# Patient Record
Sex: Female | Born: 1950 | Race: Black or African American | Hispanic: No | Marital: Married | Smoking: Former smoker
Health system: Southern US, Community
[De-identification: ages and names within clinical notes are randomized; demographics above are authoritative.]

## PROBLEM LIST (undated history)

## (undated) DIAGNOSIS — N6019 Diffuse cystic mastopathy of unspecified breast: Secondary | ICD-10-CM

## (undated) DIAGNOSIS — H5462 Unqualified visual loss, left eye, normal vision right eye: Secondary | ICD-10-CM

## (undated) DIAGNOSIS — Z86010 Personal history of colonic polyps: Secondary | ICD-10-CM

## (undated) DIAGNOSIS — D320 Benign neoplasm of cerebral meninges: Secondary | ICD-10-CM

## (undated) DIAGNOSIS — Z9889 Other specified postprocedural states: Secondary | ICD-10-CM

## (undated) DIAGNOSIS — K219 Gastro-esophageal reflux disease without esophagitis: Secondary | ICD-10-CM

## (undated) HISTORY — DX: Personal history of colonic polyps: Z86.010

## (undated) HISTORY — PX: ABDOMINAL HYSTERECTOMY: SHX81

## (undated) HISTORY — DX: Unqualified visual loss, left eye, normal vision right eye: H54.62

## (undated) HISTORY — PX: TONSILLECTOMY: SUR1361

## (undated) HISTORY — DX: Other specified postprocedural states: Z98.890

## (undated) HISTORY — DX: Gastro-esophageal reflux disease without esophagitis: K21.9

## (undated) HISTORY — PX: BREAST BIOPSY: SHX20

## (undated) HISTORY — PX: BRAIN SURGERY: SHX531

## (undated) HISTORY — DX: Benign neoplasm of cerebral meninges: D32.0

## (undated) HISTORY — DX: Diffuse cystic mastopathy of unspecified breast: N60.19

---

## 1998-09-16 ENCOUNTER — Other Ambulatory Visit: Admission: RE | Admit: 1998-09-16 | Discharge: 1998-09-16 | Payer: Self-pay | Admitting: *Deleted

## 1998-11-17 ENCOUNTER — Emergency Department (HOSPITAL_COMMUNITY): Admission: EM | Admit: 1998-11-17 | Discharge: 1998-11-17 | Payer: Self-pay | Admitting: Emergency Medicine

## 1999-08-15 ENCOUNTER — Emergency Department (HOSPITAL_COMMUNITY): Admission: EM | Admit: 1999-08-15 | Discharge: 1999-08-15 | Payer: Self-pay

## 2000-01-06 ENCOUNTER — Encounter: Payer: Self-pay | Admitting: *Deleted

## 2000-01-06 ENCOUNTER — Ambulatory Visit (HOSPITAL_COMMUNITY): Admission: RE | Admit: 2000-01-06 | Discharge: 2000-01-06 | Payer: Self-pay | Admitting: *Deleted

## 2000-01-20 ENCOUNTER — Ambulatory Visit (HOSPITAL_COMMUNITY): Admission: RE | Admit: 2000-01-20 | Discharge: 2000-01-20 | Payer: Self-pay | Admitting: Neurosurgery

## 2000-01-20 ENCOUNTER — Encounter: Payer: Self-pay | Admitting: Neurosurgery

## 2000-04-16 ENCOUNTER — Emergency Department (HOSPITAL_COMMUNITY): Admission: EM | Admit: 2000-04-16 | Discharge: 2000-04-17 | Payer: Self-pay | Admitting: Internal Medicine

## 2000-08-08 ENCOUNTER — Ambulatory Visit (HOSPITAL_COMMUNITY): Admission: RE | Admit: 2000-08-08 | Discharge: 2000-08-08 | Payer: Self-pay | Admitting: Gastroenterology

## 2000-09-17 ENCOUNTER — Other Ambulatory Visit: Admission: RE | Admit: 2000-09-17 | Discharge: 2000-09-17 | Payer: Self-pay | Admitting: Family Medicine

## 2000-10-04 ENCOUNTER — Encounter: Payer: Self-pay | Admitting: *Deleted

## 2000-10-04 ENCOUNTER — Ambulatory Visit (HOSPITAL_COMMUNITY): Admission: RE | Admit: 2000-10-04 | Discharge: 2000-10-04 | Payer: Self-pay | Admitting: *Deleted

## 2002-01-11 ENCOUNTER — Ambulatory Visit (HOSPITAL_COMMUNITY): Admission: RE | Admit: 2002-01-11 | Discharge: 2002-01-11 | Payer: Self-pay | Admitting: *Deleted

## 2002-01-11 ENCOUNTER — Encounter: Payer: Self-pay | Admitting: *Deleted

## 2002-05-14 ENCOUNTER — Other Ambulatory Visit: Admission: RE | Admit: 2002-05-14 | Discharge: 2002-05-14 | Payer: Self-pay | Admitting: Family Medicine

## 2003-01-29 ENCOUNTER — Encounter: Payer: Self-pay | Admitting: Family Medicine

## 2003-01-29 ENCOUNTER — Encounter: Admission: RE | Admit: 2003-01-29 | Discharge: 2003-01-29 | Payer: Self-pay | Admitting: Family Medicine

## 2004-01-14 ENCOUNTER — Other Ambulatory Visit: Admission: RE | Admit: 2004-01-14 | Discharge: 2004-01-14 | Payer: Self-pay | Admitting: Family Medicine

## 2004-07-18 ENCOUNTER — Ambulatory Visit (HOSPITAL_COMMUNITY): Admission: RE | Admit: 2004-07-18 | Discharge: 2004-07-18 | Payer: Self-pay | Admitting: Family Medicine

## 2004-10-03 ENCOUNTER — Ambulatory Visit (HOSPITAL_COMMUNITY): Admission: RE | Admit: 2004-10-03 | Discharge: 2004-10-03 | Payer: Self-pay | Admitting: *Deleted

## 2005-09-18 ENCOUNTER — Encounter (INDEPENDENT_AMBULATORY_CARE_PROVIDER_SITE_OTHER): Payer: Self-pay | Admitting: Specialist

## 2005-09-18 ENCOUNTER — Ambulatory Visit (HOSPITAL_COMMUNITY): Admission: RE | Admit: 2005-09-18 | Discharge: 2005-09-18 | Payer: Self-pay | Admitting: Gastroenterology

## 2005-12-13 ENCOUNTER — Other Ambulatory Visit: Admission: RE | Admit: 2005-12-13 | Discharge: 2005-12-13 | Payer: Self-pay | Admitting: Family Medicine

## 2006-05-01 ENCOUNTER — Ambulatory Visit (HOSPITAL_COMMUNITY): Admission: RE | Admit: 2006-05-01 | Discharge: 2006-05-01 | Payer: Self-pay | Admitting: Family Medicine

## 2007-07-10 ENCOUNTER — Other Ambulatory Visit: Admission: RE | Admit: 2007-07-10 | Discharge: 2007-07-10 | Payer: Self-pay | Admitting: Family Medicine

## 2008-07-29 ENCOUNTER — Ambulatory Visit (HOSPITAL_COMMUNITY): Admission: RE | Admit: 2008-07-29 | Discharge: 2008-07-29 | Payer: Self-pay | Admitting: Family Medicine

## 2009-01-25 ENCOUNTER — Encounter: Admission: RE | Admit: 2009-01-25 | Discharge: 2009-01-25 | Payer: Self-pay | Admitting: Family Medicine

## 2009-01-29 ENCOUNTER — Encounter: Admission: RE | Admit: 2009-01-29 | Discharge: 2009-01-29 | Payer: Self-pay | Admitting: Family Medicine

## 2009-02-12 ENCOUNTER — Encounter: Admission: RE | Admit: 2009-02-12 | Discharge: 2009-02-12 | Payer: Self-pay | Admitting: Neurological Surgery

## 2010-07-30 ENCOUNTER — Emergency Department (HOSPITAL_COMMUNITY)
Admission: EM | Admit: 2010-07-30 | Discharge: 2010-07-31 | Disposition: A | Discharge status: Home / Self Care | Payer: 59 | Attending: Emergency Medicine | Admitting: Emergency Medicine

## 2010-07-30 DIAGNOSIS — R112 Nausea with vomiting, unspecified: Secondary | ICD-10-CM | POA: Insufficient documentation

## 2010-07-30 DIAGNOSIS — R109 Unspecified abdominal pain: Secondary | ICD-10-CM | POA: Insufficient documentation

## 2010-07-30 DIAGNOSIS — R6883 Chills (without fever): Secondary | ICD-10-CM | POA: Insufficient documentation

## 2010-07-30 DIAGNOSIS — R197 Diarrhea, unspecified: Secondary | ICD-10-CM | POA: Insufficient documentation

## 2010-11-11 NOTE — Procedures (Signed)
[place]. [facility]  Patient:    Leslie Travis, Leslie Travis                      MRN: [id number] Proc. Date: 08/08/00 Adm. Date:  [id number] Attending:  Charna Elizabeth                           Procedure Report  DATE OF BIRTH:  03/23/1951  REFERRING PHYSICIAN:  ____________  PROCEDURE PERFORMED:  Colonoscopy.  ENDOSCOPIST:  Anselmo Rod, M.D.  INSTRUMENT USED:  Olympus video colonoscope.  INDICATIONS FOR PROCEDURE:  A 60 year old African-American female with a history of colon cancer in a maternal aunt.  Screening colonoscopy being done to rule out colonic polyps, masses, hemorrhoids, etc.  PREPROCEDURE PREPARATION:  Informed consent was procured from the patient. The patient was fasted for eight hours prior to the procedure and prepped with a bottle of magnesium citrate and a gallon of NuLytely the night prior to the procedure.  PREPROCEDURE PHYSICAL:  The patient had stable vital signs.  Neck supple. Chest clear to auscultation.  S1, S2 regular.  Abdomen soft with normal abdominal bowel sounds.  DESCRIPTION OF PROCEDURE:  The patient was placed in the left lateral decubitus position and sedated with 40 mg of Demerol and 4 mg of Versed intravenously.  Once the patient was adequately sedated and maintained on low-flow oxygen and continuous cardiac monitoring, the Olympus video colonoscope was advanced from the rectum to the cecum without difficulty.  The entire colonic mucosa appeared healthy with a normal vascular pattern.  No masses, polyps, erosions, ulcers, or diverticula were seen.  IMPRESSION:  Normal colonoscopy.  RECOMMENDATIONS: 1. Considering her family history of colon cancer, repeat colorectal cancer    screening is recommended in the next five years unless the patient were to    develop any abnormal symptoms in the interim. 2. A high fiber diet and liberal fluid intake has been advocated.  Outpatient    follow-up is advised on a p.r.n.  basis.  DD:  08/09/00 TD:  08/09/00 Job: [id number]

## 2010-11-11 NOTE — Op Note (Signed)
NAMETARALEE, Leslie Travis               ACCOUNT NO.:  [id number]   MEDICAL RECORD NO.:  [id number]          PATIENT TYPE:  AMB   LOCATION:  ENDO                         FACILITY:  [facility]   PHYSICIAN:  Anselmo Rod, M.D.  DATE OF BIRTH:  June 20, 1951   DATE OF PROCEDURE:  09/18/2005  DATE OF DISCHARGE:                                 OPERATIVE REPORT   PROCEDURE:  Colonoscopy with snare polypectomy x 1.   ENDOSCOPIST:  Jyothi. Elsie Amis, M.D.   INSTRUMENT USED:  Olympus video colonoscope.   INDICATIONS FOR PROCEDURE:  60 year old female undergoing colonoscopy.  The  patient has a family history of colon cancer, rule out colonic polyps,  masses, etc.   PREPROCEDURE PREPARATION:  Informed consent was obtained from the patient.  The patient was fasted for four hours prior to the procedure and prepped  with Osmo prep pills the night of and the morning of the procedure.  The  risks and benefits of the procedure including a 10% miss rate of cancer and  polyps were discussed with her, as well.   PREPROCEDURE PHYSICAL:  Patient with stable vital signs.  Neck supple.  Chest clear to auscultation.  S1 and S2 regular.  Abdomen soft with normal  bowel sounds.   DESCRIPTION OF PROCEDURE:  The patient was placed in the left lateral  decubitus position, sedated with 100 mcg Fentanyl and 10 mg Versed in slow  incremental doses.  Once the patient was adequately sedated, maintained on  low flow oxygen and continuous cardiac monitoring, the Olympus video  colonoscope was advanced from the rectum to the cecum.  A small sessile  polyp was snared from the IC valve and this was removed via hot snare.  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  The rest of the  exam was unremarkable.  No other masses or polyps were seen.  The  appendiceal orifice and ileocecal valve were clearly visualized and  photographed.  The terminal ileum appeared normal.   IMPRESSION:  1.  Small sessile polyp  removed by hot snare from over the IC valve.  2.  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  3.  Normal terminal ileum.   RECOMMENDATIONS:  1.  Await pathology results.  2.  Repeat colonoscopy depending on pathology results.  3.  Avoid all nonsteroidals for now.  4.  Outpatient follow up as need arises in the future.      Anselmo Rod, M.D.  Electronically Signed     JNM/MEDQ  D:  09/19/2005  T:  09/20/2005  Job:  [id number]   cc:   Dario Guardian, M.D.  Fax: [phone]

## 2011-01-16 IMAGING — CR DG CERVICAL SPINE COMPLETE 4+V
6 series · 6 of 6 positions shown · non-contrast
Comparison: None

CLINICAL DATA: Left arm pain and numbness.  No acute injury.

CERVICAL SPINE - COMPLETE 4+ VIEW

[view not recorded (1 of 6)]
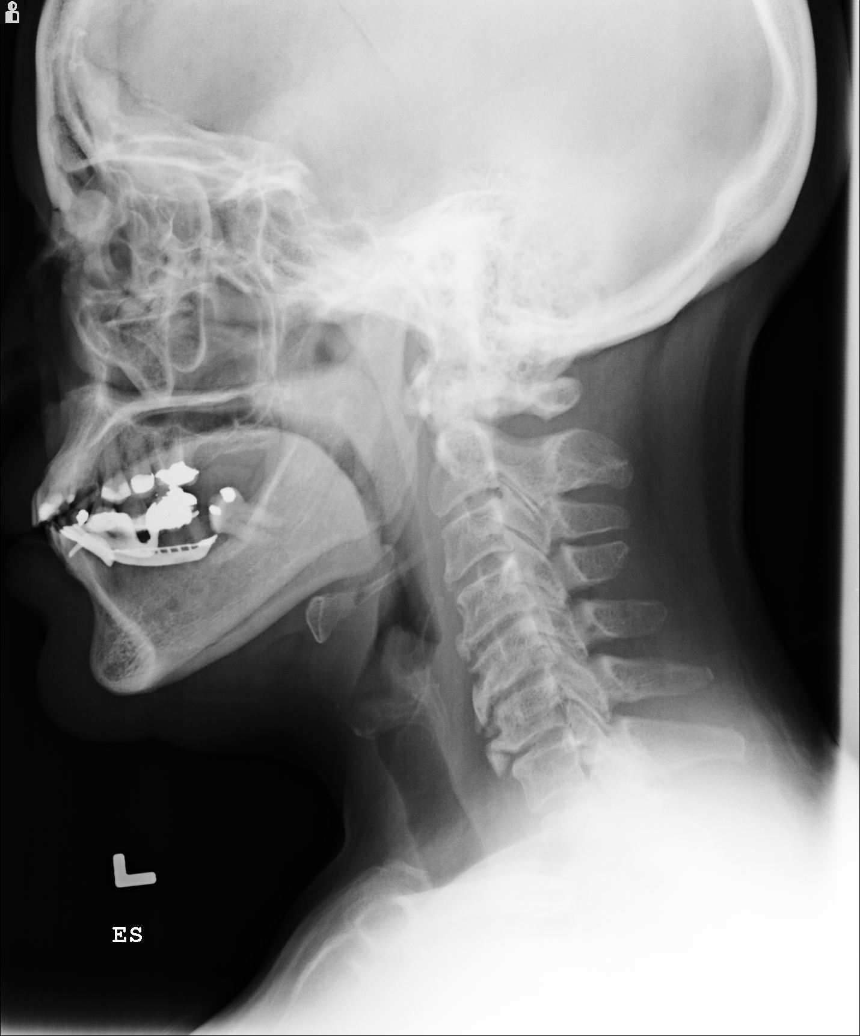

[view not recorded (2 of 6)]
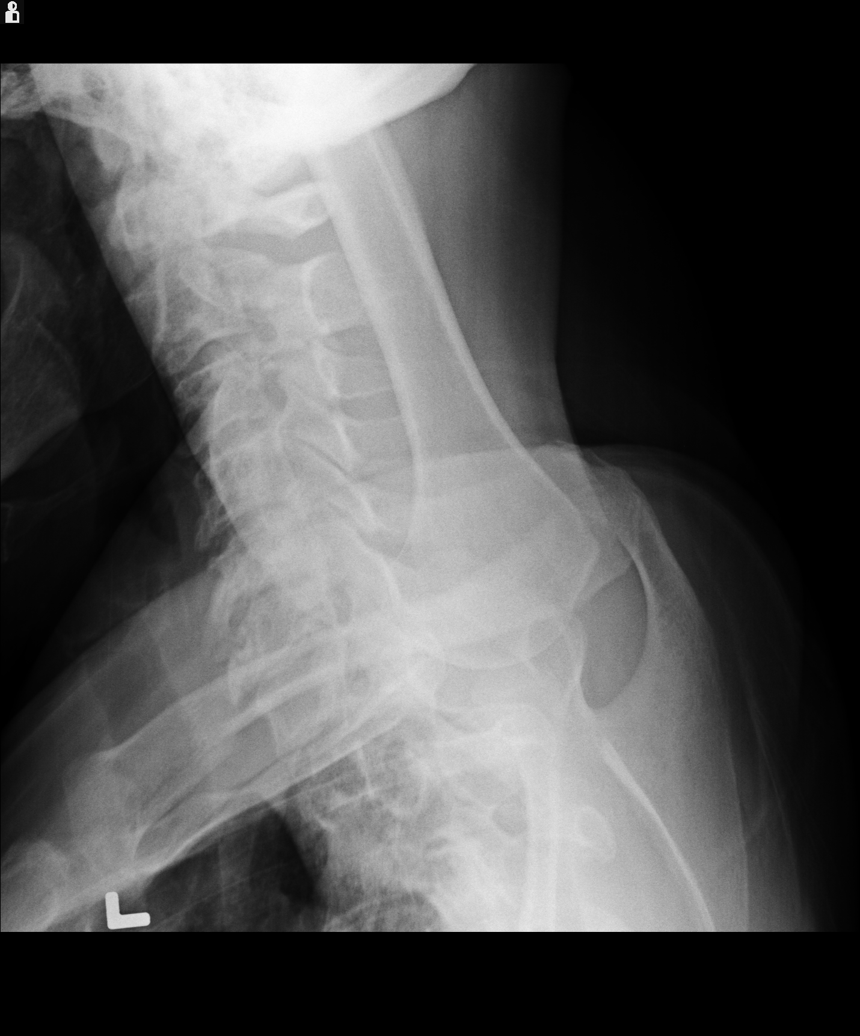

[view not recorded (3 of 6)]
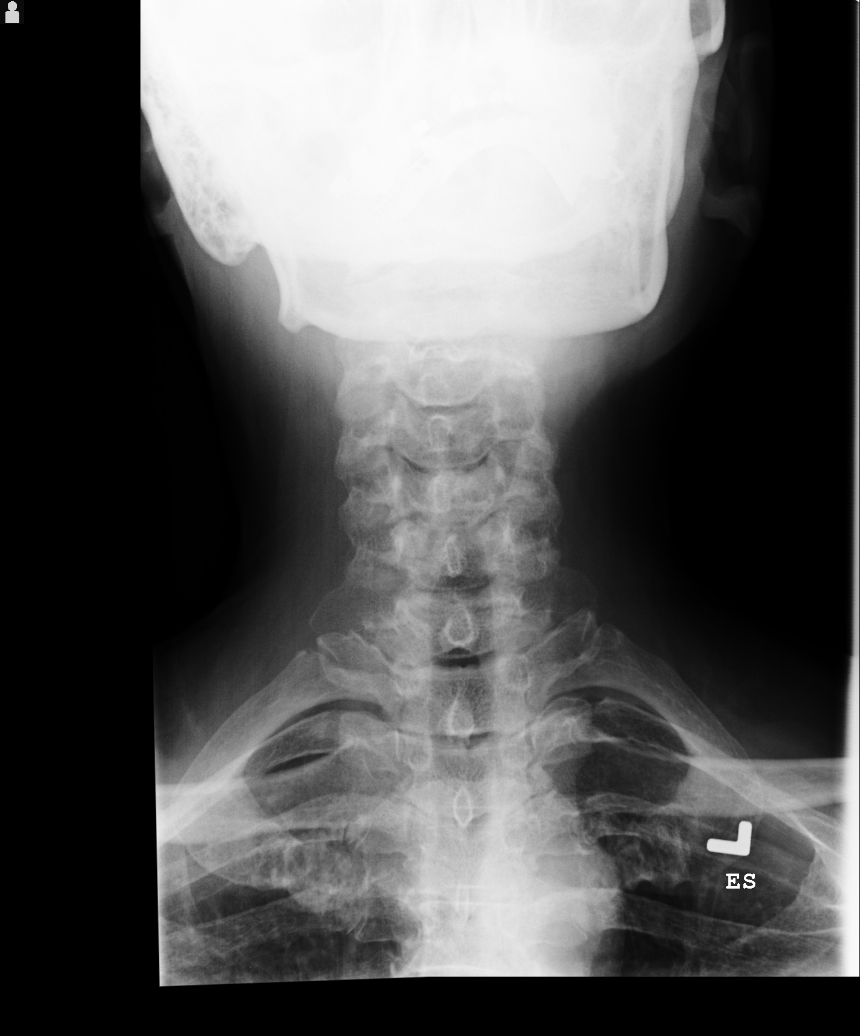

[view not recorded (4 of 6)]
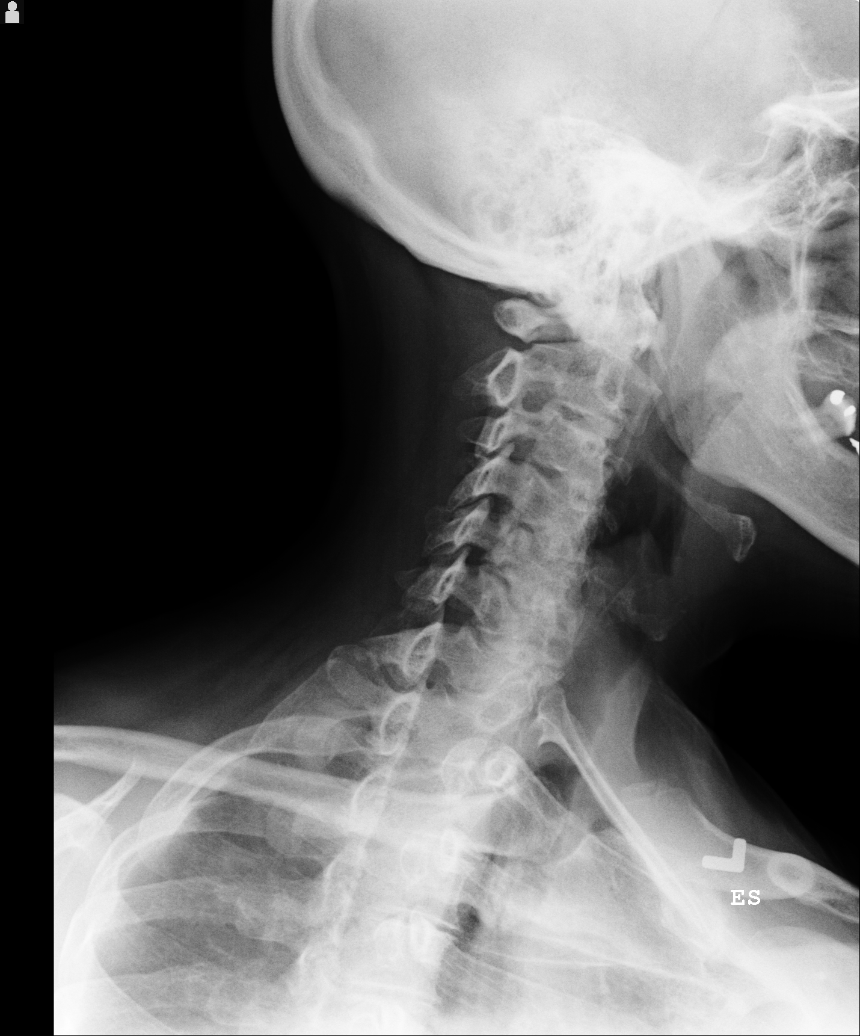

[view not recorded (5 of 6)]
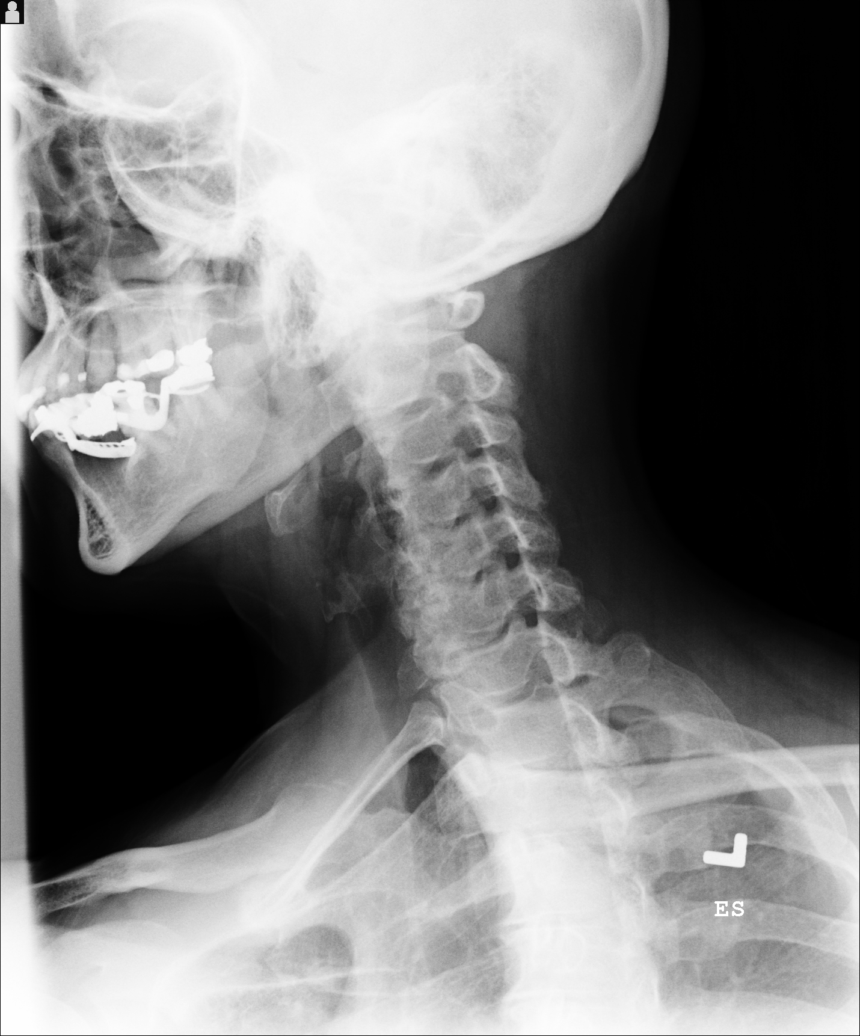

[view not recorded (6 of 6)]
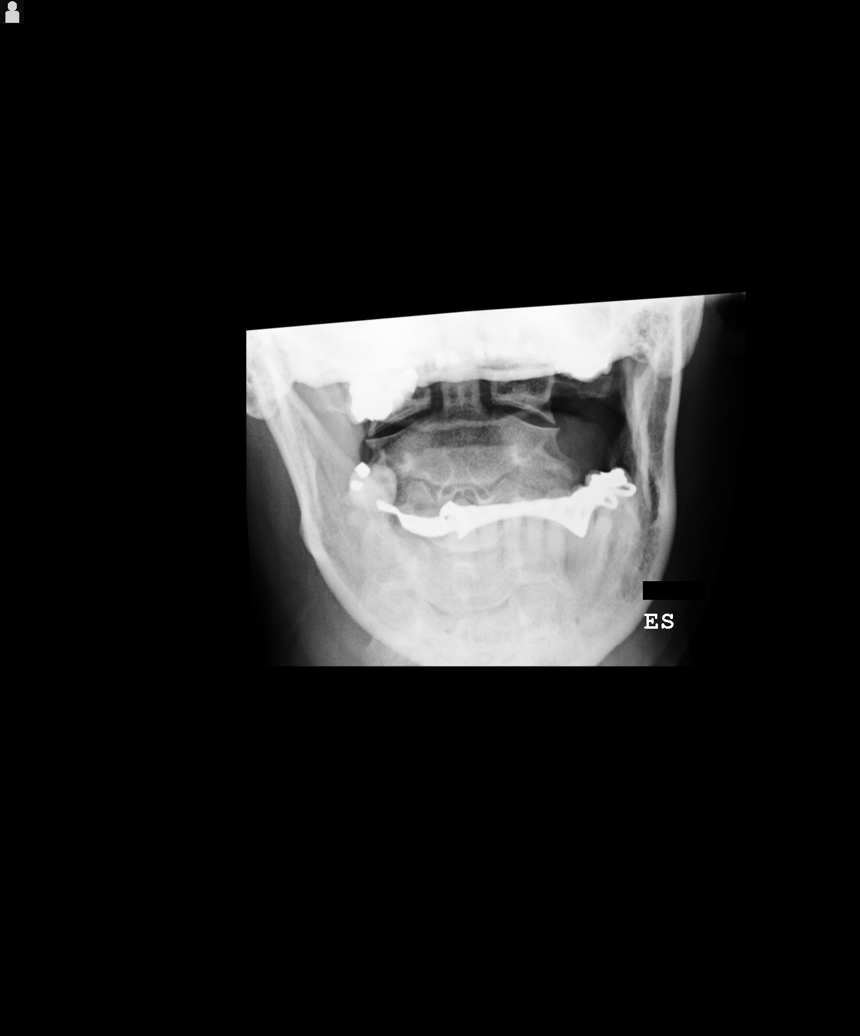

[6 of 6 positions shown; findings below may reference images not displayed]

FINDINGS: The prevertebral soft tissues are normal.  The alignment
is anatomic through T1.  There is mild disc space loss with
prominent osteophytes and facet hypertrophy at C4-C5, C5-C6 and C6-
C7.  The oblique views demonstrate mild biforaminal stenosis at C4-
C5 and C5-C6.  There is no evidence of acute fracture.  The C1-C2
articulation appears normal in the AP projection.
IMPRESSION: Spondylosis with mild biforaminal stenosis at C4-C5 and C5-C6.  No
acute osseous findings.

## 2011-10-23 DIAGNOSIS — Z86010 Personal history of colonic polyps: Secondary | ICD-10-CM

## 2011-10-23 DIAGNOSIS — Z9889 Other specified postprocedural states: Secondary | ICD-10-CM

## 2011-10-23 HISTORY — DX: Other specified postprocedural states: Z98.890

## 2011-10-23 HISTORY — DX: Personal history of colonic polyps: Z86.010

## 2011-11-24 ENCOUNTER — Emergency Department (HOSPITAL_COMMUNITY)
Admission: EM | Admit: 2011-11-24 | Discharge: 2011-11-25 | Disposition: A | Discharge status: Home / Self Care | Payer: 59 | Attending: Emergency Medicine | Admitting: Emergency Medicine

## 2011-11-24 ENCOUNTER — Encounter (HOSPITAL_COMMUNITY): Payer: Self-pay | Admitting: Emergency Medicine

## 2011-11-24 DIAGNOSIS — R197 Diarrhea, unspecified: Secondary | ICD-10-CM | POA: Insufficient documentation

## 2011-11-24 DIAGNOSIS — R10819 Abdominal tenderness, unspecified site: Secondary | ICD-10-CM | POA: Insufficient documentation

## 2011-11-24 DIAGNOSIS — R109 Unspecified abdominal pain: Secondary | ICD-10-CM | POA: Insufficient documentation

## 2011-11-24 DIAGNOSIS — R1110 Vomiting, unspecified: Secondary | ICD-10-CM

## 2011-11-24 DIAGNOSIS — N200 Calculus of kidney: Secondary | ICD-10-CM | POA: Insufficient documentation

## 2011-11-24 LAB — CBC
HCT: 43.6 % (ref 36.0–46.0)
Platelets: 291 10*3/uL (ref 150–400)
RDW: 13.6 % (ref 11.5–15.5)
WBC: 10.2 10*3/uL (ref 4.0–10.5)

## 2011-11-24 LAB — DIFFERENTIAL
Basophils Absolute: 0 10*3/uL (ref 0.0–0.1)
Basophils Relative: 0 % (ref 0–1)
Eosinophils Absolute: 0 10*3/uL (ref 0.0–0.7)
Monocytes Absolute: 1 10*3/uL (ref 0.1–1.0)
Monocytes Relative: 10 % (ref 3–12)
Neutro Abs: 8 10*3/uL — ABNORMAL HIGH (ref 1.7–7.7)
Neutrophils Relative %: 79 % — ABNORMAL HIGH (ref 43–77)

## 2011-11-24 LAB — BASIC METABOLIC PANEL
Calcium: 9.1 mg/dL (ref 8.4–10.5)
Chloride: 105 mEq/L (ref 96–112)
Creatinine, Ser: 0.77 mg/dL (ref 0.50–1.10)
GFR calc Af Amer: >90 mL/min (ref >90)
Glucose, Bld: 117 mg/dL — ABNORMAL HIGH (ref 70–99)
Potassium: 4.5 mEq/L (ref 3.5–5.1)
Sodium: 141 mEq/L (ref 135–145)

## 2011-11-24 MED ORDER — ONDANSETRON 8 MG PO TBDP
8.0000 mg | ORAL_TABLET | Freq: Once | ORAL | Status: AC
  Administered 2011-11-24: 8 mg via ORAL

## 2011-11-24 MED ORDER — DIPHENHYDRAMINE HCL 50 MG/ML IJ SOLN
25.0000 mg | Freq: Once | INTRAMUSCULAR | Status: AC
  Administered 2011-11-24: 25 mg via INTRAVENOUS
  Filled 2011-11-24: qty 1

## 2011-11-24 MED ORDER — SODIUM CHLORIDE 0.9 % IV BOLUS (SEPSIS): 1000.0000 mL | Freq: Once | INTRAVENOUS | Status: DC

## 2011-11-24 MED ORDER — METOCLOPRAMIDE HCL 5 MG/ML IJ SOLN
10.0000 mg | Freq: Once | INTRAMUSCULAR | Status: AC
  Administered 2011-11-24: 10 mg via INTRAVENOUS
  Filled 2011-11-24: qty 2

## 2011-11-24 MED ORDER — ONDANSETRON 4 MG PO TBDP: 4.0000 mg | ORAL_TABLET | Freq: Once | ORAL | Status: DC

## 2011-11-24 MED ORDER — ONDANSETRON 8 MG PO TBDP
ORAL_TABLET | ORAL | Status: AC
  Administered 2011-11-24: 8 mg via ORAL
  Filled 2011-11-24: qty 1

## 2011-11-24 MED ORDER — SODIUM CHLORIDE 0.9 % IV SOLN
INTRAVENOUS | Status: DC
  Administered 2011-11-24: via INTRAVENOUS

## 2011-11-24 MED ORDER — SODIUM CHLORIDE 0.9 % IV BOLUS (SEPSIS)
500.0000 mL | INTRAVENOUS | Status: AC
  Administered 2011-11-24: 500 mL via INTRAVENOUS

## 2011-11-24 MED ORDER — PANTOPRAZOLE SODIUM 40 MG IV SOLR
40.0000 mg | Freq: Once | INTRAVENOUS | Status: AC
  Administered 2011-11-24: 40 mg via INTRAVENOUS
  Filled 2011-11-24: qty 40

## 2011-11-24 MED ORDER — PROMETHAZINE HCL 25 MG/ML IJ SOLN
12.5000 mg | Freq: Once | INTRAMUSCULAR | Status: AC
  Administered 2011-11-24: 12.5 mg via INTRAVENOUS
  Filled 2011-11-24: qty 1

## 2011-11-24 MED ORDER — LOPERAMIDE HCL 2 MG PO CAPS
4.0000 mg | ORAL_CAPSULE | Freq: Once | ORAL | Status: AC
  Administered 2011-11-24: 4 mg via ORAL
  Filled 2011-11-24: qty 2

## 2011-11-24 NOTE — ED Notes (Signed)
Pt alert, arrives from home, c/o gen abd pain, diarrhea, nausea, onset this evening, denies recent ill exposures, resp even unlabored, skin pwd

## 2011-11-24 NOTE — ED Notes (Signed)
[id number]<YQ> Expected date:<BR> Expected time:<BR> Means of arrival:<BR> Comments:<BR> Triage 2

## 2011-11-25 ENCOUNTER — Emergency Department (HOSPITAL_COMMUNITY): Payer: 59

## 2011-11-25 LAB — HEPATIC FUNCTION PANEL
Albumin: 4 g/dL (ref 3.5–5.2)
Total Protein: 7.7 g/dL (ref 6.0–8.3)

## 2011-11-25 MED ORDER — METRONIDAZOLE 500 MG PO TABS
500.0000 mg | ORAL_TABLET | Freq: Once | ORAL | Status: AC
  Administered 2011-11-25: 500 mg via ORAL
  Filled 2011-11-25: qty 1

## 2011-11-25 MED ORDER — ONDANSETRON HCL 4 MG/2ML IJ SOLN
INTRAMUSCULAR | Status: AC
  Administered 2011-11-25: 4 mg
  Filled 2011-11-25: qty 2

## 2011-11-25 MED ORDER — CIPROFLOXACIN HCL 500 MG PO TABS: 500.0000 mg | ORAL_TABLET | Freq: Two times a day (BID) | ORAL | Status: AC

## 2011-11-25 MED ORDER — PROMETHAZINE HCL 25 MG/ML IJ SOLN
12.5000 mg | Freq: Once | INTRAMUSCULAR | Status: AC
  Administered 2011-11-25: 12.5 mg via INTRAVENOUS
  Filled 2011-11-25: qty 1

## 2011-11-25 MED ORDER — IOHEXOL 300 MG/ML  SOLN
100.0000 mL | Freq: Once | INTRAMUSCULAR | Status: AC | PRN
  Administered 2011-11-25: 100 mL via INTRAVENOUS

## 2011-11-25 MED ORDER — METRONIDAZOLE 500 MG PO TABS: 500.0000 mg | ORAL_TABLET | Freq: Three times a day (TID) | ORAL | Status: AC

## 2011-11-25 MED ORDER — CIPROFLOXACIN HCL 500 MG PO TABS
500.0000 mg | ORAL_TABLET | Freq: Once | ORAL | Status: AC
  Administered 2011-11-25: 500 mg via ORAL
  Filled 2011-11-25: qty 1

## 2011-11-25 NOTE — ED Notes (Signed)
Pt woke up, began vomiting, c/o abd cramping, MD notified

## 2011-11-25 NOTE — Discharge Instructions (Signed)
Nausea and Vomiting   Nausea means you feel sick to your stomach. Throwing up (vomiting) is a reflex where stomach contents come out of your mouth.   HOME CARE   Take medicine as told by your doctor.   Do not force yourself to eat. However, you do need to drink fluids.   If you feel like eating, eat a normal diet as told by your doctor.   Eat rice, wheat, potatoes, bread, lean meats, yogurt, fruits, and vegetables.   Avoid high-fat foods.   Drink enough fluids to keep your pee (urine) clear or pale yellow.   Ask your doctor how to replace body fluid losses (rehydrate). Signs of body fluid loss (dehydration) include:   Feeling very thirsty.   Dry lips and mouth.   Feeling dizzy.   Dark pee.   Peeing less than normal.   Feeling confused.   Fast breathing or heart rate.   GET HELP RIGHT AWAY IF:   You have blood in your throw up.   You have black or bloody poop (stool).   You have a bad headache or stiff neck.   You feel confused.   You have bad belly (abdominal) pain.   You have chest pain or trouble breathing.   You do not pee at least once every 8 hours.   You have cold, clammy skin.   You keep throwing up after 24 to 48 hours.   You have a fever.   MAKE SURE YOU:   Understand these instructions.   Will watch your condition.   Will get help right away if you are not doing well or get worse.   Document Released: 11/29/2007 Document Revised: 06/01/2011 Document Reviewed: 11/11/2010   Exit[employer] Patient Information 2012 [employer].

## 2011-11-25 NOTE — Progress Notes (Signed)
Pt awaiting CT abd. Hold off on discharge.

## 2011-11-25 NOTE — ED Provider Notes (Signed)
History     CSN: [id number]  Arrival date & time 11/24/11  2123   First MD Initiated Contact with Patient 11/24/11 2257      Chief Complaint  Patient presents with  . Abdominal Pain  . Emesis  . Diarrhea    (Consider location/radiation/quality/duration/timing/severity/associated sxs/prior treatment) HPI  Patient relates she has had "heartburn" all morning and indicates her upper chest has burning. She also states sometimes she gets a burning in her throat. She states she's had that before. She states she takes protonic. She states about noon she states some hot dogs, 8 some oatmeal cookies and draped is moody after which she started getting diffuse abdominal pain that is sharp with nausea, vomiting, and diarrhea. She denies any fever. She denies being around anybody else is ill.  PCP Dr Erby Pian  History reviewed. No pertinent past medical history.  Past Surgical History  Procedure Date  . Abdominal hysterectomy   . Brain surgery     No family history on file.  History  Substance Use Topics  . Smoking status: Never Smoker   . Smokeless tobacco: Not on file  . Alcohol Use: No  retired  OB History    Grav Para Term Preterm Abortions TAB SAB Ect Mult Living                  Review of Systems  All other systems reviewed and are negative.    Allergies  Review of patient's allergies indicates no known allergies.  Home Medications   Current Outpatient Rx  Name Route Sig Dispense Refill  . PANTOPRAZOLE SODIUM 40 MG PO TBEC Oral Take 40 mg by mouth daily.      BP 120/56  Pulse 78  Temp(Src) 97.8 F (36.6 C) (Oral)  Resp 20  Wt 235 lb (106.595 kg)  SpO2 100%  Vital signs normal    Physical Exam  Nursing note and vitals reviewed. Constitutional: She is oriented to person, place, and time. She appears well-developed and well-nourished.  Non-toxic appearance. She does not appear ill. No distress.       Patient is actively vomiting  HENT:  Head:  Normocephalic and atraumatic.  Right Ear: External ear normal.  Left Ear: External ear normal.  Nose: Nose normal. No mucosal edema or rhinorrhea.  Mouth/Throat: Oropharynx is clear and moist and mucous membranes are normal. No dental abscesses or uvula swelling.  Eyes: Conjunctivae and EOM are normal. Pupils are equal, round, and reactive to light.  Neck: Normal range of motion and full passive range of motion without pain. Neck supple.  Cardiovascular: Normal rate, regular rhythm and normal heart sounds.  Exam reveals no gallop and no friction rub.   No murmur heard. Pulmonary/Chest: Effort normal and breath sounds normal. No respiratory distress. She has no wheezes. She has no rhonchi. She has no rales. She exhibits no tenderness and no crepitus.  Abdominal: Soft. Normal appearance and bowel sounds are normal. She exhibits no distension. There is tenderness. There is no rebound and no guarding.       Mild diffuse lower abdominal tenderness  Musculoskeletal: Normal range of motion. She exhibits no edema and no tenderness.       Moves all extremities well.   Neurological: She is alert and oriented to person, place, and time. She has normal strength. No cranial nerve deficit.  Skin: Skin is warm, dry and intact. No rash noted. No erythema. No pallor.  Psychiatric: She has a normal mood and affect.  Her speech is normal and behavior is normal. Her mood appears not anxious.    ED Course  Procedures (including critical care time)   Medications  pantoprazole (PROTONIX) 40 MG tablet (not administered)  0.9 %  sodium chloride infusion (  Intravenous New Bag/Given 11/24/11 2333)  sodium chloride 0.9 % bolus 1,000 mL (not administered)  ondansetron (ZOFRAN-ODT) disintegrating tablet 8 mg (8 mg Oral Given 11/24/11 2240)  sodium chloride 0.9 % bolus 500 mL (500 mL Intravenous Given 11/24/11 2332)  metoCLOPramide (REGLAN) injection 10 mg (10 mg Intravenous Given 11/24/11 2333)  diphenhydrAMINE  (BENADRYL) injection 25 mg (25 mg Intravenous Given 11/24/11 2334)  promethazine (PHENERGAN) injection 12.5 mg (12.5 mg Intravenous Given 11/24/11 2352)  loperamide (IMODIUM) capsule 4 mg (4 mg Oral Given 11/24/11 2337)  pantoprazole (PROTONIX) injection 40 mg (40 mg Intravenous Given 11/24/11 2338)  promethazine (PHENERGAN) injection 12.5 mg (12.5 mg Intravenous Given 11/25/11 0148)   Patient continued to have vomiting after getting Zofran ODT, Phenergan, Benadryl, and Reglan. She was then given Phenergan IV.  Patient rechecked at 3:30 AM and she related she was starting to feel better and was willing to start trying to drink oral fluids.  06:00 Nurse had reported pt feeling better and ready to go home, however she now states she has been having sharp intermittant lower abdominal pains. She has some tenderness in the suprapubic and RLQ. No guarding or rebound. PT states she has had a appy and hysterectomy. Will get AP CT.   PT turned over to Dr Juleen China at change of shift to get AP CT results.    Results for orders placed during the hospital encounter of 11/24/11  BASIC METABOLIC PANEL      Component Value Range   Sodium 141  135 - 145 (mEq/L)   Potassium 4.5  3.5 - 5.1 (mEq/L)   Chloride 105  96 - 112 (mEq/L)   CO2 22  19 - 32 (mEq/L)   Glucose, Bld 117 (*) 70 - 99 (mg/dL)   BUN 11  6 - 23 (mg/dL)   Creatinine, Ser 3.08  0.50 - 1.10 (mg/dL)   Calcium 9.1  8.4 - 65.7 (mg/dL)   GFR calc non Af Amer 89 (*) >90 (mL/min)   GFR calc Af Amer >90  >90 (mL/min)  CBC      Component Value Range   WBC 10.2  4.0 - 10.5 (K/uL)   RBC 5.23 (*) 3.87 - 5.11 (MIL/uL)   Hemoglobin 14.5  12.0 - 15.0 (g/dL)   HCT 84.6  96.2 - 95.2 (%)   MCV 83.4  78.0 - 100.0 (fL)   MCH 27.7  26.0 - 34.0 (pg)   MCHC 33.3  30.0 - 36.0 (g/dL)   RDW 84.1  32.4 - 40.1 (%)   Platelets 291  150 - 400 (K/uL)  DIFFERENTIAL      Component Value Range   Neutrophils Relative 79 (*) 43 - 77 (%)   Neutro Abs 8.0 (*) 1.7 - 7.7 (K/uL)    Lymphocytes Relative 11 (*) 12 - 46 (%)   Lymphs Abs 1.1  0.7 - 4.0 (K/uL)   Monocytes Relative 10  3 - 12 (%)   Monocytes Absolute 1.0  0.1 - 1.0 (K/uL)   Eosinophils Relative 0  0 - 5 (%)   Eosinophils Absolute 0.0  0.0 - 0.7 (K/uL)   Basophils Relative 0  0 - 1 (%)   Basophils Absolute 0.0  0.0 - 0.1 (K/uL)  HEPATIC FUNCTION PANEL  Component Value Range   Total Protein 7.7  6.0 - 8.3 (g/dL)   Albumin 4.0  3.5 - 5.2 (g/dL)   AST 21  0 - 37 (U/L)   ALT 17  0 - 35 (U/L)   Alkaline Phosphatase 81  39 - 117 (U/L)   Total Bilirubin 0.4  0.3 - 1.2 (mg/dL)   Bilirubin, Direct <1.6  0.0 - 0.3 (mg/dL)   Indirect Bilirubin NOT CALCULATED  0.3 - 0.9 (mg/dL)  LIPASE, BLOOD      Component Value Range   Lipase 16  11 - 59 (U/L)   Laboratory interpretation all normal     Date: 11/25/2011  Rate: 76  Rhythm: normal sinus rhythm  QRS Axis: normal  Intervals: normal  ST/T Wave abnormalities: normal  Conduction Disutrbances:none  Narrative Interpretation:   Old EKG Reviewed: none available  Diagnoses that have been ruled out:  None  Diagnoses that are still under consideration:  None  Final diagnoses:  Vomiting and diarrhea  Abdominal pain   Disposition per Dr Roberto Scales, MD, FACEP    MDM          Ward Givens, MD 11/25/11 2259

## 2011-11-25 NOTE — ED Notes (Signed)
Pt verbalizes understanding 

## 2013-10-06 ENCOUNTER — Encounter: Payer: Self-pay | Admitting: *Deleted

## 2016-04-18 ENCOUNTER — Ambulatory Visit
Admission: RE | Admit: 2016-04-18 | Discharge: 2016-04-18 | Disposition: A | Discharge status: Home / Self Care | Payer: Medicare Other | Source: Ambulatory Visit | Attending: Family Medicine | Admitting: Family Medicine

## 2016-04-18 ENCOUNTER — Other Ambulatory Visit: Payer: Self-pay | Admitting: Family Medicine

## 2016-04-18 DIAGNOSIS — M25551 Pain in right hip: Secondary | ICD-10-CM

## 2017-12-25 ENCOUNTER — Other Ambulatory Visit: Payer: Self-pay | Admitting: Family Medicine

## 2017-12-25 ENCOUNTER — Ambulatory Visit
Admission: RE | Admit: 2017-12-25 | Discharge: 2017-12-25 | Disposition: A | Discharge status: Home / Self Care | Payer: Medicare Other | Source: Ambulatory Visit | Attending: Family Medicine | Admitting: Family Medicine

## 2017-12-25 DIAGNOSIS — M545 Low back pain: Secondary | ICD-10-CM

## 2018-02-11 ENCOUNTER — Ambulatory Visit
Admission: RE | Admit: 2018-02-11 | Discharge: 2018-02-11 | Disposition: A | Discharge status: Home / Self Care | Payer: Medicare Other | Source: Ambulatory Visit | Attending: Family Medicine | Admitting: Family Medicine

## 2018-02-11 ENCOUNTER — Other Ambulatory Visit: Payer: Self-pay | Admitting: Family Medicine

## 2018-02-11 DIAGNOSIS — M25551 Pain in right hip: Secondary | ICD-10-CM

## 2018-06-05 ENCOUNTER — Ambulatory Visit
Admission: RE | Admit: 2018-06-05 | Discharge: 2018-06-05 | Disposition: A | Discharge status: Home / Self Care | Payer: Medicare Other | Source: Ambulatory Visit | Attending: Family Medicine | Admitting: Family Medicine

## 2018-06-05 ENCOUNTER — Other Ambulatory Visit: Payer: Self-pay | Admitting: Family Medicine

## 2018-06-05 DIAGNOSIS — R1030 Lower abdominal pain, unspecified: Secondary | ICD-10-CM

## 2018-06-05 MED ORDER — IOPAMIDOL (ISOVUE-300) INJECTION 61%
125.0000 mL | Freq: Once | INTRAVENOUS | Status: AC | PRN
  Administered 2018-06-05: 125 mL via INTRAVENOUS

## 2019-12-16 IMAGING — CR DG LUMBAR SPINE 2-3V
3 series · 3 of 3 positions shown · non-contrast
Comparison: 11/25/2011

CLINICAL DATA: Low back pain for several weeks

EXAM:
LUMBAR SPINE - 3 VIEW

[w lumbar spine ap]
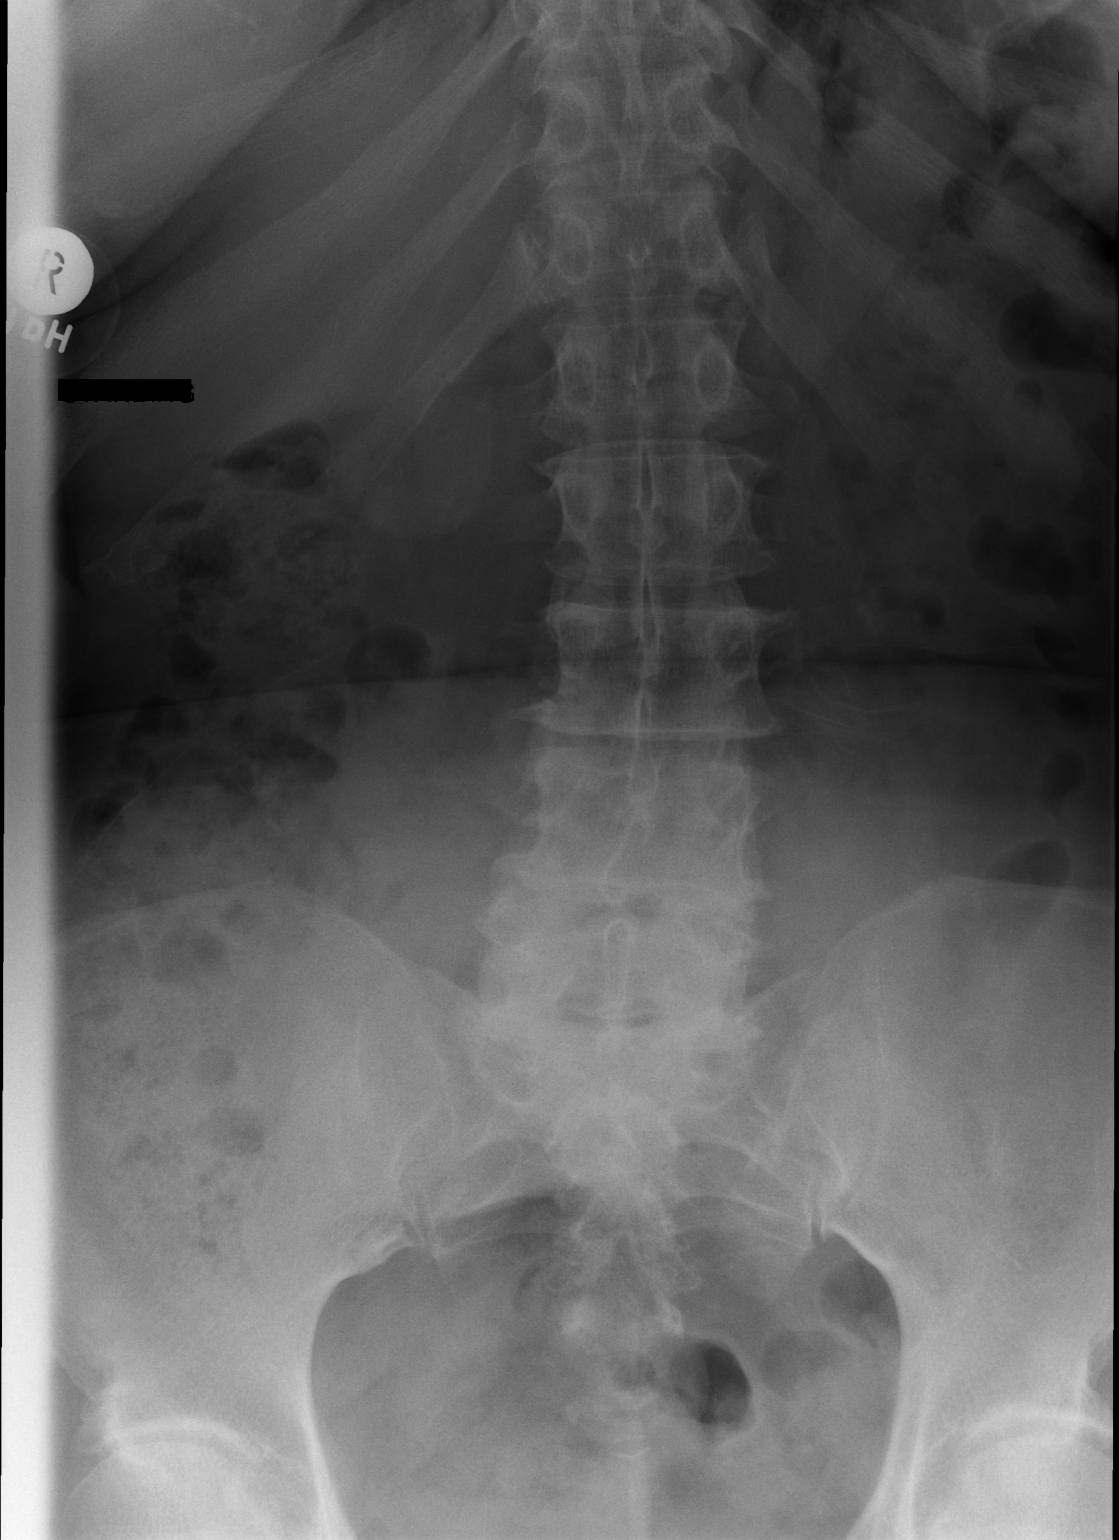

[w lumbar spine lat]
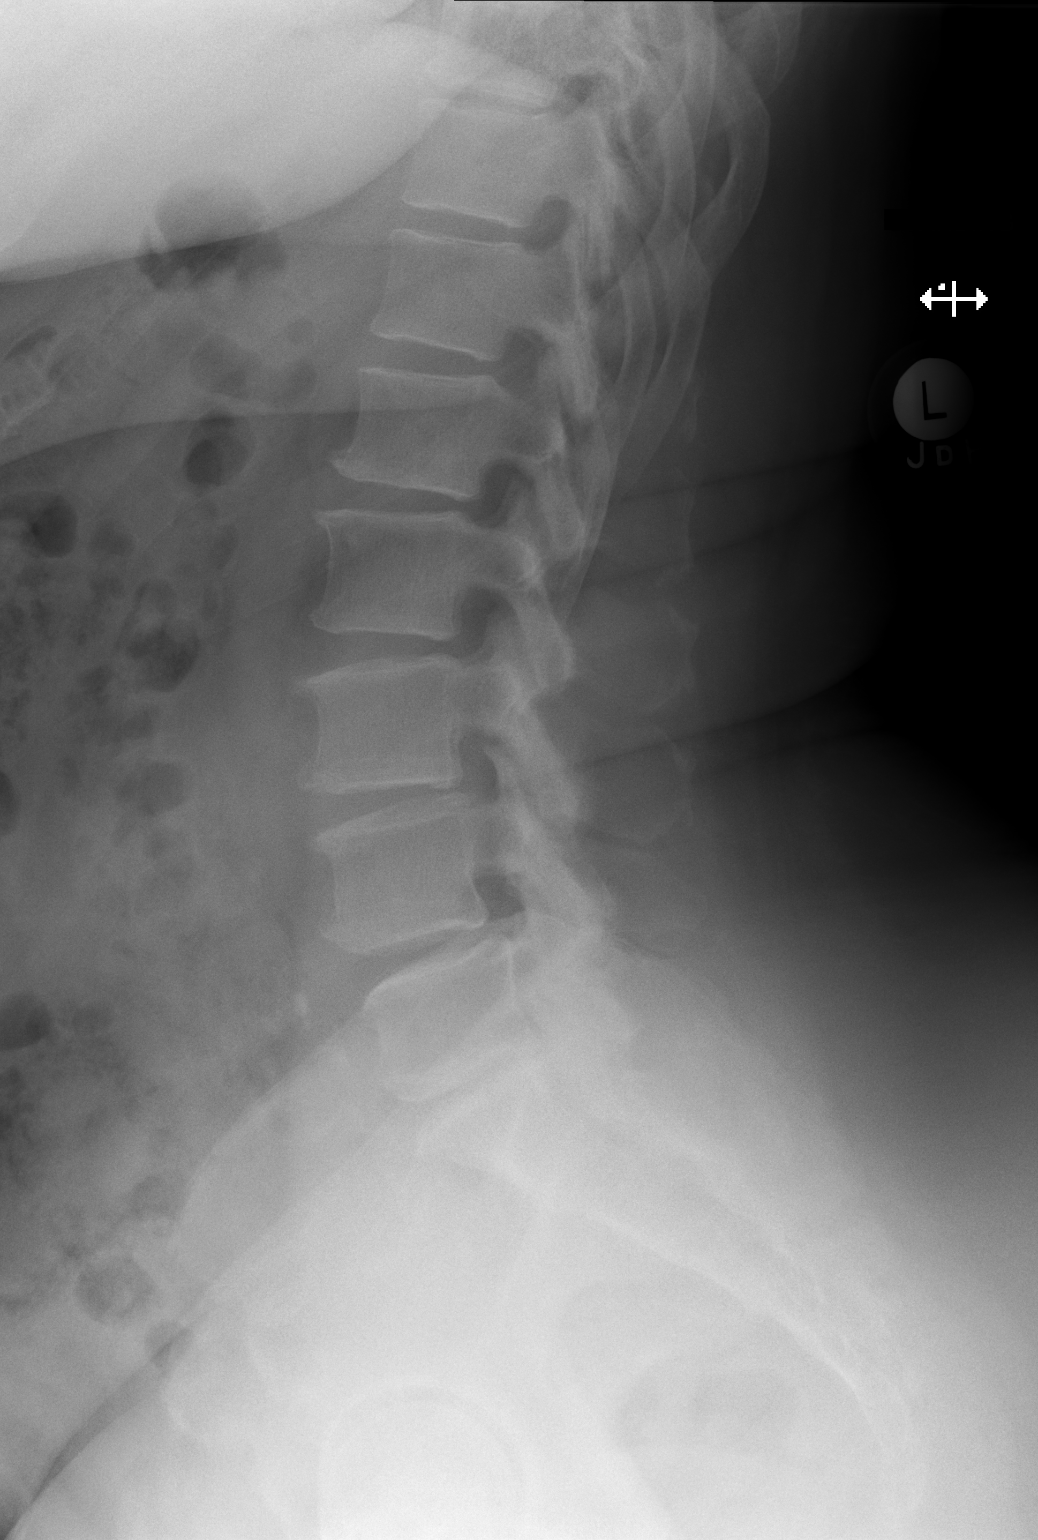

[w lumbar l-5 s-1 spot]
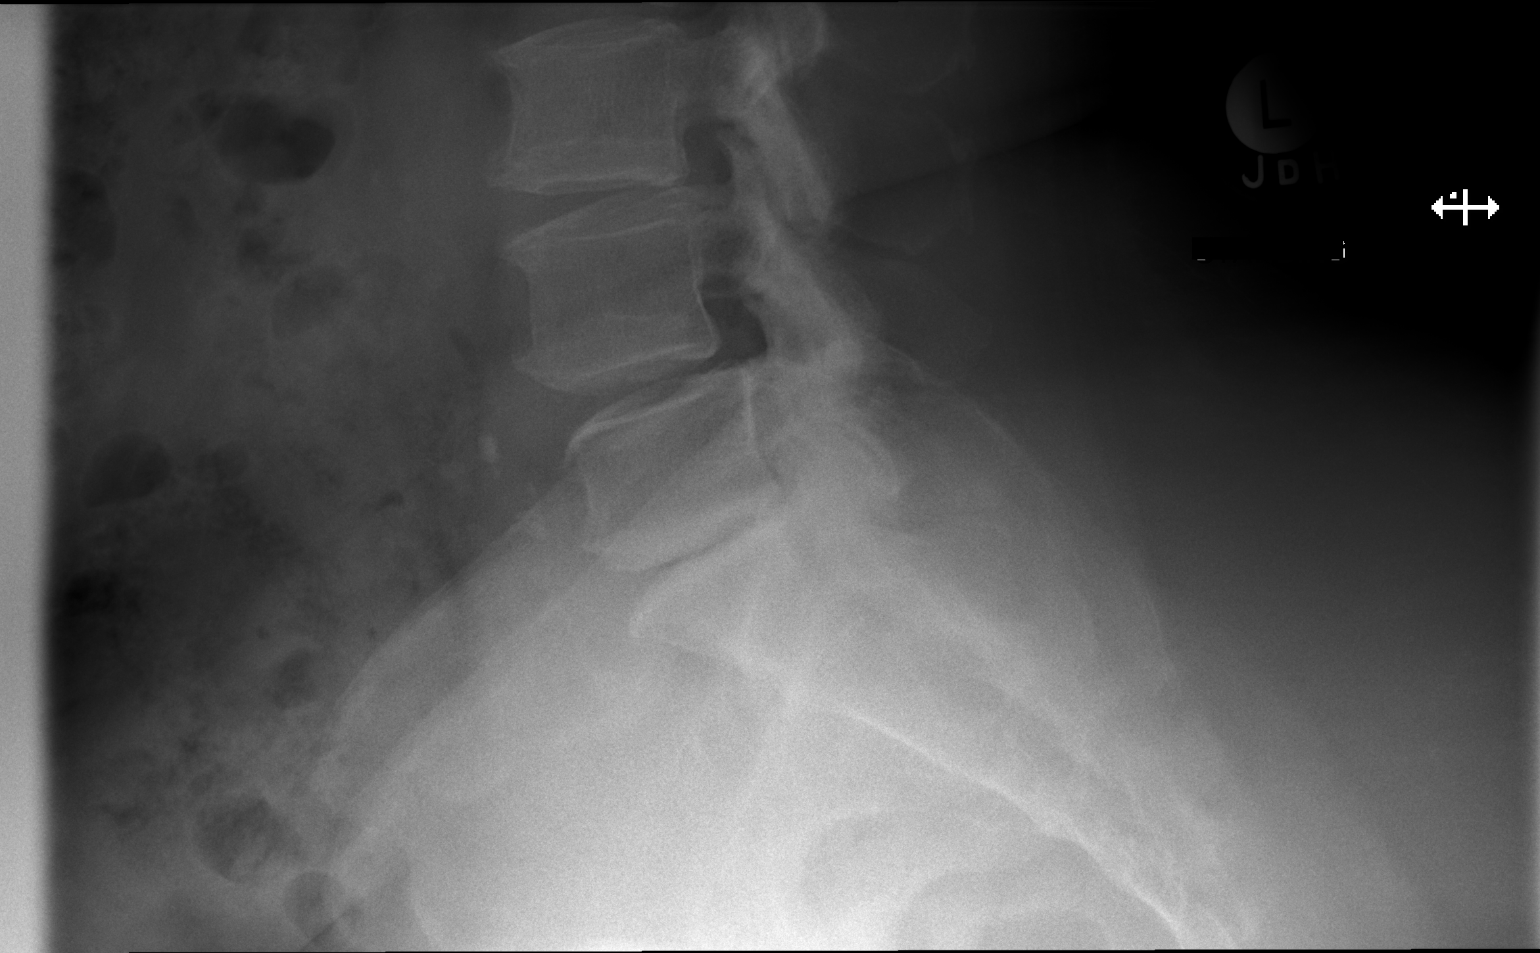

[3 of 3 positions shown; findings below may reference images not displayed]

FINDINGS: Five lumbar type vertebral bodies are well visualized. Vertebral
body height is well maintained. Mild osteophytic changes are noted
at all levels. Disc space narrowing at L4-5 and L5-S1 is seen. Mild
anterolisthesis of L4 on L5 is noted related to degenerative changes
no pars defects are seen. No soft tissue abnormality is noted.
IMPRESSION: Degenerative change without acute abnormality. Degenerative
anterolisthesis of L4 on L5 is noted.

## 2020-07-20 ENCOUNTER — Other Ambulatory Visit: Payer: Medicare Other

## 2020-07-20 ENCOUNTER — Other Ambulatory Visit: Payer: Self-pay

## 2020-07-20 DIAGNOSIS — Z20822 Contact with and (suspected) exposure to covid-19: Secondary | ICD-10-CM

## 2020-07-22 LAB — NOVEL CORONAVIRUS, NAA: SARS-CoV-2, NAA: NOT DETECTED

## 2020-07-22 LAB — SARS-COV-2, NAA 2 DAY TAT

## 2020-07-23 ENCOUNTER — Telehealth: Payer: Self-pay | Admitting: General Practice

## 2020-07-23 NOTE — Telephone Encounter (Signed)
Patient called to get COVID results. Made her aware they were negative. 

## 2023-03-12 ENCOUNTER — Emergency Department (HOSPITAL_COMMUNITY)
Admission: EM | Admit: 2023-03-12 | Discharge: 2023-03-12 | Disposition: A | Discharge status: Home / Self Care | Payer: Medicare Other | Attending: Emergency Medicine | Admitting: Emergency Medicine

## 2023-03-12 ENCOUNTER — Encounter (HOSPITAL_COMMUNITY): Payer: Self-pay

## 2023-03-12 ENCOUNTER — Other Ambulatory Visit: Payer: Self-pay

## 2023-03-12 DIAGNOSIS — Z87891 Personal history of nicotine dependence: Secondary | ICD-10-CM | POA: Insufficient documentation

## 2023-03-12 DIAGNOSIS — L03113 Cellulitis of right upper limb: Secondary | ICD-10-CM

## 2023-03-12 DIAGNOSIS — L03114 Cellulitis of left upper limb: Secondary | ICD-10-CM | POA: Diagnosis present

## 2023-03-12 MED ORDER — CEPHALEXIN 500 MG PO CAPS: 500.0000 mg | ORAL_CAPSULE | Freq: Four times a day (QID) | ORAL | 0 refills | Status: AC

## 2023-03-12 NOTE — Discharge Instructions (Signed)
We evaluated you for the redness on your hand.  This could be due to an insect bite or could be due to a skin infection.  I prescribed antibiotics to take for this.  You can also take 1000 mg of Tylenol every 6 hours as needed for pain.  Please follow-up closely with your primary care physician for a re-check. Return if your symptoms worsen.

## 2023-03-12 NOTE — ED Triage Notes (Signed)
Pt complaining of reddness, swelling, and pain in the left hand. Pt noticed it this morning while putting on her jacket. Concerned that it a bug bite.

## 2023-03-12 NOTE — ED Provider Notes (Signed)
[place] EMERGENCY DEPARTMENT AT [facility] Provider Note  CSN: [id number] Arrival date & time: 03/12/23 4098  Chief Complaint(s) Insect Bite  HPI Leslie Travis is a 72 y.o. female presenting to the emergency department with redness to the left hand.  She noticed that this morning while putting on a jacket.  Unsure if she was bit by a bug or not.  Does not think it was there yesterday.  No fevers or chills, nausea or vomiting.  It is painful.  Not itchy.   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  Home Medication(s) Prior to Admission medications   Medication Sig Start Date End Date Taking? Authorizing Provider  cephALEXin (KEFLEX) 500 MG capsule Take 1 capsule (500 mg total) by mouth 4 (four) times daily. 03/12/23  Yes Lonell Grandchild, MD  Cholecalciferol (VITAMIN D) 2000 UNITS CAPS Take by mouth.    [provider]  cyclobenzaprine (FLEXERIL) 10 MG tablet Take 10 mg by mouth 3 (three) times daily as needed for muscle spasms.    [provider]  Multiple Vitamin (MULTIVITAMIN) capsule Take 1 capsule by mouth daily.    [provider]  pantoprazole (PROTONIX) 40 MG tablet Take 40 mg by mouth daily.    [provider]  triamterene-hydrochlorothiazide (DYAZIDE) 37.5-25 MG per capsule Take 1 capsule by mouth daily.    [provider]                                                                                                                                    Past Surgical History Past Surgical  History:  Procedure Laterality Date   ABDOMINAL HYSTERECTOMY     partial, ovaries intact   BRAIN SURGERY     meningioma removal   BREAST BIOPSY Left    benign   TONSILLECTOMY     Family History Family History  Problem Relation Age of Onset   Hypertension Father    Congestive Heart Failure Father    Breast cancer Mother    Diabetes Paternal Grandmother    Cervical cancer Maternal Grandmother    Hypertension Brother    Diabetes Brother    Lung cancer Brother    Hypertension Brother    Colon cancer Brother    Hypertension Sister    Diabetes Sister     Social History Social History   Tobacco Use   Smoking status: Former   Tobacco comments:    quit 1991  Substance  Use Topics   Alcohol use: No   Allergies Patient has no known allergies.  Review of Systems Review of Systems  All other systems reviewed and are negative.   Physical Exam Vital Signs  I have reviewed the triage vital signs BP 133/82   Pulse 66   Temp 98.3 F (36.8 C)   Resp 16   Ht 5\' 7"  (1.702 m)   Wt 116.1 kg   SpO2 100%   BMI 40.10 kg/m  Physical Exam Vitals and nursing note reviewed.  Constitutional:      Appearance: Normal appearance.  HENT:     Head: Normocephalic and atraumatic.     Mouth/Throat:     Mouth: Mucous membranes are moist.  Eyes:     Conjunctiva/sclera: Conjunctivae normal.  Cardiovascular:     Rate and Rhythm: Normal rate.  Pulmonary:     Effort: Pulmonary effort is normal. No respiratory distress.  Abdominal:     General: Abdomen is flat.  Musculoskeletal:        General: No deformity.     Comments: No tenderness to the hand, full range of motion of all fingers, no digit swelling. No bony tenderness  Skin:    General: Skin is warm and dry.     Capillary Refill: Capillary refill takes less than 2 seconds.     Comments: Left hand with approximately 5 x 2 area of redness on the dorsum close to the first and second digits, no purulence, no wound  Neurological:      General: No focal deficit present.     Mental Status: She is alert. Mental status is at baseline.  Psychiatric:        Mood and Affect: Mood normal.        Behavior: Behavior normal.     ED Results and Treatments Labs (all labs ordered are listed, but only abnormal results are displayed) Labs Reviewed - No data to display                                                                                                                        Radiology No results found.  Pertinent labs & imaging results that were available during my care of the patient were reviewed by me and considered in my medical decision making (see MDM for details).  Medications Ordered in ED Medications - No data to display  Procedures Procedures  (including critical care time)  Medical Decision Making / ED Course   MDM:  72 year old presenting with area of redness to the hand.  Seems most consistent with cellulitis.  Area is warm, erythematous.  No obvious wounds.  Is not itchy so less likely to be hypersensitivity reaction.  Will treat with Keflex.  No evidence of FTS, abscess or other dangerous infectious process.      Medicines ordered and prescription drug management: Meds ordered this encounter  Medications   cephALEXin (KEFLEX) 500 MG capsule    Sig: Take 1 capsule (500 mg total) by mouth 4 (four) times daily.    Dispense:  28 capsule    Refill:  0    -I have reviewed the patients home medicines and have made adjustments as needed   Social Determinants of Health:  Diagnosis or treatment significantly limited by social determinants of health: obesity   Reevaluation: After the interventions noted above, I reevaluated the patient and found that their symptoms have improved  Co morbidities that complicate the patient evaluation  Past Medical History:   Diagnosis Date   Esophageal reflux    Fibrocystic breast disease    History of colonoscopy with polypectomy 10/23/2011   2 polyps adenomas with mild diverticula   Meningioma, recurrent of brain [facility]) [phone]   neurosurgeon at [facility], tuberculum sellae meningioma, affecting left optic nerve, slow progression 2014   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    Final Clinical Impression(s) / ED Diagnoses Final diagnoses:  Cellulitis of right upper extremity     This chart was dictated using voice recognition software.  Despite best efforts to proofread,  errors can occur which can change the documentation meaning.    Lonell Grandchild, MD 03/12/23 1225

## 2023-08-12 ENCOUNTER — Encounter (HOSPITAL_BASED_OUTPATIENT_CLINIC_OR_DEPARTMENT_OTHER): Payer: Self-pay | Admitting: Urology

## 2023-08-12 ENCOUNTER — Emergency Department (HOSPITAL_BASED_OUTPATIENT_CLINIC_OR_DEPARTMENT_OTHER)
Admission: EM | Admit: 2023-08-12 | Discharge: 2023-08-12 | Disposition: A | Discharge status: Home / Self Care | Payer: Medicare Other | Attending: Emergency Medicine | Admitting: Emergency Medicine

## 2023-08-12 ENCOUNTER — Other Ambulatory Visit: Payer: Self-pay

## 2023-08-12 ENCOUNTER — Emergency Department (HOSPITAL_BASED_OUTPATIENT_CLINIC_OR_DEPARTMENT_OTHER): Payer: Medicare Other

## 2023-08-12 DIAGNOSIS — R059 Cough, unspecified: Secondary | ICD-10-CM | POA: Diagnosis present

## 2023-08-12 DIAGNOSIS — U071 COVID-19: Secondary | ICD-10-CM | POA: Insufficient documentation

## 2023-08-12 DIAGNOSIS — Z87891 Personal history of nicotine dependence: Secondary | ICD-10-CM | POA: Diagnosis not present

## 2023-08-12 LAB — RESP PANEL BY RT-PCR (RSV, FLU A&B, COVID)  RVPGX2
Influenza A by PCR: NEGATIVE
Influenza B by PCR: NEGATIVE
Resp Syncytial Virus by PCR: NEGATIVE
SARS Coronavirus 2 by RT PCR: POSITIVE — AB

## 2023-08-12 MED ORDER — OXYMETAZOLINE HCL 0.05 % NA SOLN: 1.0000 | Freq: Two times a day (BID) | NASAL | 0 refills | Status: AC

## 2023-08-12 MED ORDER — IBUPROFEN 600 MG PO TABS: 600.0000 mg | ORAL_TABLET | Freq: Four times a day (QID) | ORAL | 0 refills | Status: AC | PRN

## 2023-08-12 MED ORDER — KETOROLAC TROMETHAMINE 15 MG/ML IJ SOLN
15.0000 mg | Freq: Once | INTRAMUSCULAR | Status: AC
  Administered 2023-08-12: 15 mg via INTRAMUSCULAR
  Filled 2023-08-12: qty 1

## 2023-08-12 MED ORDER — ACETAMINOPHEN 325 MG PO TABS: 650.0000 mg | ORAL_TABLET | Freq: Four times a day (QID) | ORAL | 0 refills | Status: AC | PRN

## 2023-08-12 MED ORDER — ACETAMINOPHEN 325 MG PO TABS
650.0000 mg | ORAL_TABLET | Freq: Once | ORAL | Status: AC
  Administered 2023-08-12: 650 mg via ORAL
  Filled 2023-08-12: qty 2

## 2023-08-12 MED ORDER — GUAIFENESIN-DM 100-10 MG/5ML PO SYRP: 5.0000 mL | ORAL_SOLUTION | ORAL | 0 refills | Status: AC | PRN

## 2023-08-12 NOTE — ED Triage Notes (Signed)
 Pt states body aches, chills, fever, headache,and nausea x 2 days  States Family has FLU

## 2023-08-12 NOTE — Discharge Instructions (Addendum)
 You have been seen in the Emergency Department (ED) today for a likely viral illness.  Please drink plenty of clear fluids (water, Gatorade, chicken broth, etc).  You may use Tylenol and/or Motrin according to label instructions.  You can alternate between the two without any side effects.  Drink plenty fluids, get plenty of rest.  Consider using a humidifier in your bedroom.  Please follow up with your doctor as listed above.  Call your doctor or return to the Emergency Department (ED) if you are unable to tolerate fluids due to vomiting, have worsening trouble breathing, become extremely tired or difficult to awaken, or if you develop any other symptoms that concern you.

## 2023-08-12 NOTE — ED Provider Notes (Signed)
 [facility] Provider Note  CSN: [id number] Arrival date & time: 08/12/23 1010  Chief Complaint(s) Flu like symptoms   HPI Leslie Travis is a 73 y.o. female with past medical history as below, significant for fibrocystic breast disease, meningioma, left-sided vision loss, esophageal reflux who presents to the ED with complaint of cough, congestion, rhinorrhea, body aches  She reports sick contacts at home with similar symptoms.  Failure was recently diagnosed with the flu.  She has been feeling unwell for the past few days, cough, congestion, runny nose, body aches, nausea without vomiting.  She is tolerating p.o., using the bathroom normally.  No rashes.  No medications prior to arrival.  Past Medical History Past Medical History:  Diagnosis Date   Esophageal reflux    Fibrocystic breast disease    History of colonoscopy with polypectomy 10/23/2011   2 polyps adenomas with mild diverticula   Meningioma, recurrent of brain [facility]) [phone]   neurosurgeon at [facility], tuberculum sellae meningioma, affecting left optic nerve, slow progression 2014   Vision loss of left eye    due to brain surgery   There are no active problems to display for this patient.  Home Medication(s) Prior to Admission medications   Medication Sig Start Date End Date Taking? Authorizing Provider  acetaminophen (TYLENOL) 325 MG tablet Take 2 tablets (650 mg total) by mouth every 6 (six) hours as needed. 08/12/23  Yes Tanda Rockers A, DO  guaiFENesin-dextromethorphan (ROBITUSSIN DM) 100-10 MG/5ML syrup Take 5 mLs by mouth every 4 (four) hours as needed for cough. 08/12/23  Yes Tanda Rockers A, DO  ibuprofen (ADVIL) 600 MG tablet Take 1 tablet (600 mg total) by mouth every 6 (six) hours as needed. 08/12/23  Yes Sloan Leiter, DO  oxymetazoline (AFRIN NASAL SPRAY) 0.05 % nasal spray Place 1 spray into both nostrils 2 (two) times daily for 3 days. 08/12/23 08/15/23 Yes  Sloan Leiter, DO  cephALEXin (KEFLEX) 500 MG capsule Take 1 capsule (500 mg total) by mouth 4 (four) times daily. 03/12/23   Lonell Grandchild, MD  Cholecalciferol (VITAMIN D) 2000 UNITS CAPS Take by mouth.    [provider]  cyclobenzaprine (FLEXERIL) 10 MG tablet Take 10 mg by mouth 3 (three) times daily as needed for muscle spasms.    [provider]  Multiple Vitamin (MULTIVITAMIN) capsule Take 1 capsule by mouth daily.    [provider]  pantoprazole (PROTONIX) 40 MG tablet Take 40 mg by mouth daily.    [provider]  triamterene-hydrochlorothiazide (DYAZIDE) 37.5-25 MG per capsule Take 1 capsule by mouth daily.    [provider]                                                                                                                                    Past Surgical History Past Surgical History:  Procedure Laterality Date  ABDOMINAL HYSTERECTOMY     partial, ovaries intact   BRAIN SURGERY     meningioma removal   BREAST BIOPSY Left    benign   TONSILLECTOMY     Family History Family History  Problem Relation Age of Onset   Hypertension Father    Congestive Heart Failure Father    Breast cancer Mother    Diabetes Paternal Grandmother    Cervical cancer Maternal Grandmother    Hypertension Brother    Diabetes Brother    Lung cancer Brother    Hypertension Brother    Colon cancer Brother    Hypertension Sister    Diabetes Sister     Social History Social History   Tobacco Use   Smoking status: Former   Tobacco comments:    quit 1991  Substance Use Topics   Alcohol use: No   Allergies Patient has no known allergies.  Review of Systems A thorough review of systems was obtained and all systems are negative except as noted in the HPI and PMH.   Physical Exam Vital Signs  I have reviewed the triage vital signs BP (!) 121/54 (BP Location: Right Arm)   Pulse (!) 101   Temp (!) 100.6 F (38.1 C) (Oral)    Resp 18   Ht 5\' 7"  (1.702 m)   Wt 116 kg   SpO2 97%   BMI 40.05 kg/m  Physical Exam Vitals and nursing note reviewed.  Constitutional:      General: She is not in acute distress.    Appearance: Normal appearance.  HENT:     Head: Normocephalic and atraumatic.     Right Ear: External ear normal.     Left Ear: External ear normal.     Nose: Nose normal.     Mouth/Throat:     Mouth: Mucous membranes are moist.  Eyes:     General: No scleral icterus.       Right eye: No discharge.        Left eye: No discharge.  Cardiovascular:     Rate and Rhythm: Tachycardia present.     Pulses: Normal pulses.  Pulmonary:     Effort: Pulmonary effort is normal. No respiratory distress.     Breath sounds: Normal breath sounds. No stridor.  Abdominal:     General: Abdomen is flat. There is no distension.     Palpations: Abdomen is soft.     Tenderness: There is no abdominal tenderness.  Musculoskeletal:     Cervical back: No rigidity.     Right lower leg: No edema.     Left lower leg: No edema.  Skin:    General: Skin is warm and dry.     Capillary Refill: Capillary refill takes less than 2 seconds.  Neurological:     Mental Status: She is alert.  Psychiatric:        Mood and Affect: Mood normal.        Behavior: Behavior normal. Behavior is cooperative.     ED Results and Treatments Labs (all labs ordered are listed, but only abnormal results are displayed) Labs Reviewed  RESP PANEL BY RT-PCR (RSV, FLU A&B, COVID)  RVPGX2 - Abnormal; Notable for the following components:      Result Value   SARS Coronavirus 2 by RT PCR POSITIVE (*)    All other components within normal limits  Radiology DG Chest Portable 1 View Result Date: 08/12/2023 CLINICAL DATA:  Cough.  Headache. EXAM: PORTABLE CHEST 1 VIEW COMPARISON:  None Available. FINDINGS: Low lung volume. Bilateral  lung fields are clear. Bilateral costophrenic angles are clear. Normal cardio-mediastinal silhouette. No acute osseous abnormalities. The soft tissues are within normal limits. IMPRESSION: No active disease. Electronically Signed   By: Jules Schick M.D.   On: 08/12/2023 11:25    Pertinent labs & imaging results that were available during my care of the patient were reviewed by me and considered in my medical decision making (see MDM for details).  Medications Ordered in ED Medications  ketorolac (TORADOL) 15 MG/ML injection 15 mg (has no administration in time range)  acetaminophen (TYLENOL) tablet 650 mg (650 mg Oral Given 08/12/23 1057)                                                                                                                                     Procedures Procedures  (including critical care time)  Medical Decision Making / ED Course    Medical Decision Making:    Leslie Travis is a 73 y.o. female with past medical history as below, significant for fibrocystic breast disease, meningioma, left-sided vision loss, esophageal reflux who presents to the ED with complaint of cough, congestion, rhinorrhea, body aches. The complaint involves an extensive differential diagnosis and also carries with it a high risk of complications and morbidity.  Serious etiology was considered. Ddx includes but is not limited to: COVID-19, influenza, RSV, pneumonia, etc.  Complete initial physical exam performed, notably the patient was in no distress, heart rate is elevated, she has a fever.  No hypoxia..    Reviewed and confirmed nursing documentation for past medical history, family history, social history.  Vital signs reviewed.       Brief summary: 73 year old female history above here with URI symptoms.  Found to have COVID-19.  Chest x-ray without infiltrate or obvious pneumonia.  Agree with radiologist.  She has no hypoxia, initially tachycardic and febrile, defervesced with  Tylenol.  Also gave Toradol.  Discussed supportive care at home for COVID-19, anticipatory guidance.  Encouraged to follow-up with PCP.  The patient improved significantly and was discharged in stable condition. Detailed discussions were had with the patient/guardian regarding current findings, and need for close f/u with PCP or on call doctor. The patient/guardian has been instructed to return immediately if the symptoms worsen in any way for re-evaluation. Patient/guardian verbalized understanding and is in agreement with current care plan. All questions answered prior to discharge.                  Additional history obtained: -Additional history obtained from na -External records from outside source obtained and reviewed including: Chart review including previous notes, labs, imaging, consultation notes including  Home medications, prior labs   Lab Tests: -I ordered, reviewed, and interpreted labs.  The pertinent results include:   Labs Reviewed  RESP PANEL BY RT-PCR (RSV, FLU A&B, COVID)  RVPGX2 - Abnormal; Notable for the following components:      Result Value   SARS Coronavirus 2 by RT PCR POSITIVE (*)    All other components within normal limits    Notable for COVID  EKG   EKG Interpretation Date/Time:    Ventricular Rate:    PR Interval:    QRS Duration:    QT Interval:    QTC Calculation:   R Axis:      Text Interpretation:           Imaging Studies ordered: I ordered imaging studies including chest x-ray I independently visualized the following imaging with scope of interpretation limited to determining acute life threatening conditions related to emergency care; findings noted above I independently visualized and interpreted imaging. I agree with the radiologist interpretation   Medicines ordered and prescription drug management: Meds ordered this encounter  Medications   acetaminophen (TYLENOL) tablet 650 mg   ketorolac (TORADOL) 15  MG/ML injection 15 mg   oxymetazoline (AFRIN NASAL SPRAY) 0.05 % nasal spray    Sig: Place 1 spray into both nostrils 2 (two) times daily for 3 days.    Dispense:  15 mL    Refill:  0   acetaminophen (TYLENOL) 325 MG tablet    Sig: Take 2 tablets (650 mg total) by mouth every 6 (six) hours as needed.    Dispense:  36 tablet    Refill:  0   ibuprofen (ADVIL) 600 MG tablet    Sig: Take 1 tablet (600 mg total) by mouth every 6 (six) hours as needed.    Dispense:  30 tablet    Refill:  0   guaiFENesin-dextromethorphan (ROBITUSSIN DM) 100-10 MG/5ML syrup    Sig: Take 5 mLs by mouth every 4 (four) hours as needed for cough.    Dispense:  118 mL    Refill:  0    -I have reviewed the patients home medicines and have made adjustments as needed   Consultations Obtained: na   Cardiac Monitoring: The patient was maintained on a cardiac monitor.  I personally viewed and interpreted the cardiac monitored which showed an underlying rhythm of: sinus tachy > NSR Continuous pulse oximetry interpreted by myself, 98% on RA.    Social Determinants of Health:  Diagnosis or treatment significantly limited by social determinants of health: former smoker   Reevaluation: After the interventions noted above, I reevaluated the patient and found that they have improved  Co morbidities that complicate the patient evaluation  Past Medical History:  Diagnosis Date   Esophageal reflux    Fibrocystic breast disease    History of colonoscopy with polypectomy 10/23/2011   2 polyps adenomas with mild diverticula   Meningioma, recurrent of brain [facility]) [phone]   neurosurgeon at [facility], tuberculum sellae meningioma, affecting left optic nerve, slow progression 2014   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    Final Clinical Impression(s) / ED  Diagnoses Final diagnoses:  COVID-19        Sloan Leiter, DO 08/12/23 1218

## 2023-08-12 NOTE — ED Notes (Signed)
 X-ray at bedside

## 2024-04-15 ENCOUNTER — Other Ambulatory Visit: Payer: Self-pay | Admitting: Orthopedic Surgery

## 2024-04-17 ENCOUNTER — Encounter: Payer: Self-pay | Admitting: Gastroenterology

## 2024-04-17 LAB — SURGICAL PATHOLOGY

## 2024-04-18 ENCOUNTER — Other Ambulatory Visit: Payer: Self-pay | Admitting: Gastroenterology

## 2024-04-18 DIAGNOSIS — R1010 Upper abdominal pain, unspecified: Secondary | ICD-10-CM

## 2024-04-24 ENCOUNTER — Ambulatory Visit
Admission: RE | Admit: 2024-04-24 | Discharge: 2024-04-24 | Disposition: A | Source: Ambulatory Visit | Attending: Gastroenterology

## 2024-04-24 DIAGNOSIS — R1010 Upper abdominal pain, unspecified: Secondary | ICD-10-CM

## 2024-04-29 ENCOUNTER — Other Ambulatory Visit: Payer: Self-pay | Admitting: Gastroenterology

## 2024-04-29 DIAGNOSIS — K7460 Unspecified cirrhosis of liver: Secondary | ICD-10-CM

## 2024-04-29 DIAGNOSIS — K760 Fatty (change of) liver, not elsewhere classified: Secondary | ICD-10-CM

## 2024-05-13 ENCOUNTER — Ambulatory Visit (HOSPITAL_COMMUNITY)
Admission: RE | Admit: 2024-05-13 | Discharge: 2024-05-13 | Disposition: A | Discharge status: Home / Self Care | Source: Ambulatory Visit | Attending: Vascular Surgery | Admitting: Vascular Surgery

## 2024-05-13 ENCOUNTER — Other Ambulatory Visit (HOSPITAL_COMMUNITY): Payer: Self-pay | Admitting: Medical

## 2024-05-13 DIAGNOSIS — M79604 Pain in right leg: Secondary | ICD-10-CM

## 2024-05-14 ENCOUNTER — Other Ambulatory Visit (HOSPITAL_COMMUNITY): Payer: Self-pay | Admitting: Gastroenterology

## 2024-05-14 ENCOUNTER — Ambulatory Visit
Admission: RE | Admit: 2024-05-14 | Discharge: 2024-05-14 | Disposition: A | Discharge status: Home / Self Care | Source: Ambulatory Visit | Attending: Gastroenterology | Admitting: Gastroenterology

## 2024-05-14 DIAGNOSIS — K7460 Unspecified cirrhosis of liver: Secondary | ICD-10-CM

## 2024-05-14 DIAGNOSIS — K760 Fatty (change of) liver, not elsewhere classified: Secondary | ICD-10-CM

## 2024-05-27 ENCOUNTER — Ambulatory Visit (HOSPITAL_COMMUNITY)
Admission: RE | Admit: 2024-05-27 | Discharge: 2024-05-27 | Disposition: A | Source: Ambulatory Visit | Attending: Gastroenterology

## 2024-05-27 ENCOUNTER — Encounter (HOSPITAL_COMMUNITY): Payer: Self-pay

## 2024-05-27 DIAGNOSIS — K760 Fatty (change of) liver, not elsewhere classified: Secondary | ICD-10-CM | POA: Diagnosis present

## 2024-05-27 DIAGNOSIS — K7460 Unspecified cirrhosis of liver: Secondary | ICD-10-CM | POA: Insufficient documentation

## 2024-05-27 MED ORDER — IOHEXOL 300 MG/ML  SOLN
100.0000 mL | Freq: Once | INTRAMUSCULAR | Status: AC | PRN
  Administered 2024-05-27: 100 mL via INTRAVENOUS

## 2024-05-27 MED ORDER — SODIUM CHLORIDE (PF) 0.9 % IJ SOLN
INTRAMUSCULAR | Status: AC
  Filled 2024-05-27: qty 50

## 2024-05-30 LAB — POCT I-STAT CREATININE: Creatinine, Ser: 1 mg/dL (ref 0.44–1.00)
